# Patient Record
Sex: Female | Born: 2000 | Race: White | Hispanic: No | Marital: Single | Smoking: Never smoker
Health system: Southern US, Community
[De-identification: ages and names within clinical notes are randomized; demographics above are authoritative.]

## PROBLEM LIST (undated history)

## (undated) DIAGNOSIS — R569 Unspecified convulsions: Secondary | ICD-10-CM

---
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
## 2021-10-27 ENCOUNTER — Other Ambulatory Visit: Payer: Self-pay

## 2021-10-27 ENCOUNTER — Emergency Department (HOSPITAL_COMMUNITY)
Admission: EM | Admit: 2021-10-27 | Discharge: 2021-10-28 | Disposition: A | Discharge status: Home / Self Care | Payer: Medicaid Other | Attending: Emergency Medicine | Admitting: Emergency Medicine

## 2021-10-27 ENCOUNTER — Encounter (HOSPITAL_COMMUNITY): Payer: Self-pay | Admitting: Student

## 2021-10-27 DIAGNOSIS — Z23 Encounter for immunization: Secondary | ICD-10-CM | POA: Insufficient documentation

## 2021-10-27 DIAGNOSIS — F419 Anxiety disorder, unspecified: Secondary | ICD-10-CM | POA: Diagnosis not present

## 2021-10-27 DIAGNOSIS — S59912A Unspecified injury of left forearm, initial encounter: Secondary | ICD-10-CM | POA: Diagnosis present

## 2021-10-27 DIAGNOSIS — W260XXA Contact with knife, initial encounter: Secondary | ICD-10-CM | POA: Diagnosis not present

## 2021-10-27 DIAGNOSIS — S51812A Laceration without foreign body of left forearm, initial encounter: Secondary | ICD-10-CM | POA: Insufficient documentation

## 2021-10-27 MED ORDER — TETANUS-DIPHTH-ACELL PERTUSSIS 5-2.5-18.5 LF-MCG/0.5 IM SUSY
0.5000 mL | PREFILLED_SYRINGE | Freq: Once | INTRAMUSCULAR | Status: AC
  Administered 2021-10-27: 0.5 mL via INTRAMUSCULAR
  Filled 2021-10-27: qty 0.5

## 2021-10-27 NOTE — Discharge Instructions (Addendum)
Please follow-up with [facility] as soon as possible.  Take Atarax every 8 hours as needed for panic attack/anxiety. ? ?We have prescribed you new medication(s) today. Discuss the medications prescribed today with your pharmacist as they can have adverse effects and interactions with your other medicines including over the counter and prescribed medications. Seek medical evaluation if you start to experience new or abnormal symptoms after taking one of these medicines, seek care immediately if you start to experience difficulty breathing, feeling of your throat closing, facial swelling, or rash as these could be indications of a more serious allergic reaction ? ?Return to the emergency department for any new or worsening symptoms including but not limited to thoughts of self-harm, thoughts of hurting other people, hearing voices or seeing things that other people do not hear/see, self harm behavior, or any other concerns. ?

## 2021-10-27 NOTE — ED Provider Notes (Addendum)
?[facility] ?Provider Note ? ? ?CSN: [id number] ?Arrival date & time: 10/27/21  2230 ? ?  ? ?History ? ?Chief Complaint  ?Patient presents with  ? Panic Attack  ? ? ?Penny Schwartz is a 21 y.o. female with a hx of seizures who presents to the ED with complaints of a panic attack which occurred shortly prior to arrival and is improved at present.  Patient states that she did not feel well earlier therefore she called out of work, however she is actively getting feel very anxious about this.  She states that her roommate was not there and her anxiety escalated to a panic attack.  She reports her panic attack symptoms included anxiety, nausea, dry heaving, hyperventilating, and muscle tightness throughout.  States that sometimes when she has these panic attacks with muscle tightness she starts to think that if she cuts the muscle area this will help relieve the tightness.  She did utilize a knife and cut her left forearm a few times.  She ultimately feels much better now.  She states that her arm is a little bit sore.  She has no other areas of pain.  No other alleviating or aggravating factors.  She has a history of panic attacks and states this all felt the same.  She is looking for a new psychiatrist in the area.  She specifically denies suicidal ideations, suicidal intent by cutting, homicidal ideations, or hallucinations.  Unsure of her last tetanus shot. ? ?HPI ? ?  ? ?Home Medications ?Prior to Admission medications   ?Medication Sig Start Date End Date Taking? Authorizing Provider  ?escitalopram (LEXAPRO) 20 MG tablet Take 20 mg by mouth daily.   Yes [provider]  ?levETIRAcetam (KEPPRA) 1000 MG tablet 1000 mg in the morning, and 1500 mg at night ?Patient taking differently: Take 750 mg by mouth at bedtime. 12/03/12  Yes Louanne Skye, MD  ?   ? ?Allergies    ?Lamictal [lamotrigine]   ? ?Review of Systems   ?Review of Systems  ?Constitutional:  Negative for chills  and fever.  ?Respiratory:  Positive for chest tightness and shortness of breath.   ?Gastrointestinal:  Positive for nausea. Negative for abdominal pain.  ?Genitourinary:  Negative for dysuria.  ?Musculoskeletal:  Positive for myalgias.  ?Neurological:  Negative for syncope.  ?Psychiatric/Behavioral:  Negative for suicidal ideas. The patient is nervous/anxious.   ?All other systems reviewed and are negative. ? ?Physical Exam ?Updated Vital Signs ?BP 106/62   Pulse 80   Temp 98.3 ?F (36.8 ?C) (Oral)   Resp 16   Ht 5\' 2"  (1.575 m)   Wt 81.6 kg   LMP 10/23/2021   SpO2 100%   BMI 32.92 kg/m?  ?Physical Exam ?Vitals and nursing note reviewed.  ?Constitutional:   ?   General: She is not in acute distress. ?   Appearance: Normal appearance. She is well-developed. She is not ill-appearing or toxic-appearing.  ?HENT:  ?   Head: Normocephalic and atraumatic.  ?Eyes:  ?   General:     ?   Right eye: No discharge.     ?   Left eye: No discharge.  ?   Conjunctiva/sclera: Conjunctivae normal.  ?Neck:  ?   Comments: No midline tenderness.  ?Cardiovascular:  ?   Rate and Rhythm: Normal rate and regular rhythm.  ?   Pulses:     ?     Radial pulses are 2+ on the right side and 2+ on  the left side.  ?Pulmonary:  ?   Effort: No respiratory distress.  ?   Breath sounds: Normal breath sounds. No wheezing or rales.  ?Abdominal:  ?   General: There is no distension.  ?   Palpations: Abdomen is soft.  ?   Tenderness: There is no abdominal tenderness.  ?Musculoskeletal:  ?   Cervical back: Normal range of motion and neck supple.  ?   Comments: Upper extremities: Patient has linear superficial laceration/abrasions to the left ventral forearm.  No subcutaneous tissue exposure.  Some are scabbed, very stages of healing.  No significant ecchymosis, erythema, increased warmth, or purulence.  No induration or fluctuance.  Patient has intact AROM throughout.  No significant tenderness to palpation.  Compartments are soft.  ?Skin: ?    General: Skin is warm and dry.  ?   Capillary Refill: Capillary refill takes less than 2 seconds.  ?Neurological:  ?   Mental Status: She is alert.  ?   Comments: Clear speech.  Sensation grossly intact bilateral upper extremities.  5-5 symmetric strength.  Able to perform okay sign, thumbs up, cross second/third digits bilaterally.  ?Psychiatric:     ?   Mood and Affect: Mood normal.     ?   Behavior: Behavior is cooperative.     ?   Thought Content: Thought content does not include homicidal or suicidal ideation.  ? ? ?ED Results / Procedures / Treatments   ?Labs ?(all labs ordered are listed, but only abnormal results are displayed) ?Labs Reviewed - No data to display ? ?EKG ?None ? ?Radiology ?No results found. ? ?Procedures ?Procedures  ? ? ?Medications Ordered in ED ?Medications - No data to display ? ?ED Course/ Medical Decision Making/ A&P ?  ?                        ?Medical Decision Making ?Risk ?Prescription drug management. ? ? ?Patient presents to the emergency department status post panic attack with self injures behavior.  She is nontoxic, resting comfortably, vitals without significant abnormality.  On exam patient has fairly superficial linear abrasion/lacerations to the left ventral forearm.  Wounds are not particularly deep, no SQ tissue exposure, does not appear to require repair with suture/staple/skin adhesive.  She is neurovascular intact distally.  Tetanus updated today.  Chart/nursing notes reviewed for additional history. ? ?EKG obtained, normal sinus rhythm. ? ?Patient is relatively asymptomatic at this time.  She reports that she cut herself during her panic attack as she thought this would help with her skin/muscle tightness, she has done this previously during her panic attacks.  She adamantly denies that she was not trying to hurt herself or commit suicide.  She in general denies suicidal ideation, homicidal ideation, or hallucination.  She is alert and oriented.  We discussed TTS  assessment, risk/benefits, patient declined, contracted for safety and has a friend that she is going home with.. Will discharge w/ prn atarax and close [facility] follow up. I discussed results, treatment plan, need for follow-up, and return precautions with the patient. Provided opportunity for questions, patient confirmed understanding and is in agreement with plan.  ? ? ?Final Clinical Impression(s) / ED Diagnoses ?Final diagnoses:  ?Anxiety  ? ? ?Rx / DC Orders ?ED Discharge Orders   ? ?      Ordered  ?  hydrOXYzine (ATARAX) 10 MG tablet  Every 8 hours PRN       ? 10/28/21 0005  ? ?  ?  ? ?  ? ? ?  ?  Amaryllis Dyke, PA-C ?10/28/21 0015 ? ? ? ? ?  ?Amaryllis Dyke, PA-C ?10/28/21 0020 ? ?  ?Maudie Flakes, MD ?10/28/21 [phone] ? ?

## 2021-10-27 NOTE — ED Triage Notes (Addendum)
Pt arrived via EMS from home for a panic attack. Pt has hx of anxiety and depression and has been out of her abilify for a month. Pt had a panic attack tonight where she felt as if her forearm skin was tightening, and she has superficial lacerations to her left forearm. Bleeding is controlled. Pt is voluntary ?

## 2021-10-28 MED ORDER — HYDROXYZINE HCL 10 MG PO TABS: 10.0000 mg | ORAL_TABLET | Freq: Three times a day (TID) | ORAL | 0 refills | Status: AC | PRN

## 2021-10-28 NOTE — ED Notes (Addendum)
Discharge instructions reviewed, questions answered. Patient encouraged to seek outpatient mental health services tomorrow morning and return to ED for any suicidal thoughts. Rx education provided. Pt states understanding and no further questions. Pt ambulatory with steady gait upon discharge. No s/s of distress noted. ?

## 2025-01-19 ENCOUNTER — Inpatient Hospital Stay (HOSPITAL_COMMUNITY): Admission: AD | Admit: 2025-01-19 | Source: Home / Self Care | Admitting: Obstetrics and Gynecology
